# Patient Record
Sex: Male | Born: 1940 | Race: White | Hispanic: No | Marital: Single | Smoking: Never smoker
Health system: Southern US, Community
[De-identification: ages and names within clinical notes are randomized; demographics above are authoritative.]

---

## 2007-07-14 ENCOUNTER — Encounter: Admission: RE | Admit: 2007-07-14 | Discharge: 2007-07-14 | Payer: Self-pay | Admitting: Orthopedic Surgery

## 2009-01-10 ENCOUNTER — Encounter: Admission: RE | Admit: 2009-01-10 | Discharge: 2009-01-10 | Payer: Self-pay | Admitting: Sports Medicine

## 2009-11-28 ENCOUNTER — Encounter: Admission: RE | Admit: 2009-11-28 | Discharge: 2009-11-28 | Payer: Self-pay | Admitting: Sports Medicine

## 2009-12-19 ENCOUNTER — Encounter: Admission: RE | Admit: 2009-12-19 | Discharge: 2009-12-19 | Payer: Self-pay | Admitting: Sports Medicine

## 2011-04-02 ENCOUNTER — Ambulatory Visit
Admission: RE | Admit: 2011-04-02 | Discharge: 2011-04-02 | Disposition: A | Discharge status: Home / Self Care | Payer: Self-pay | Source: Ambulatory Visit | Attending: Sports Medicine | Admitting: Sports Medicine

## 2011-04-02 ENCOUNTER — Other Ambulatory Visit: Payer: Self-pay | Admitting: Sports Medicine

## 2011-04-02 DIAGNOSIS — M25561 Pain in right knee: Secondary | ICD-10-CM

## 2011-08-04 ENCOUNTER — Other Ambulatory Visit: Payer: Self-pay | Admitting: Orthopaedic Surgery

## 2011-08-04 DIAGNOSIS — R609 Edema, unspecified: Secondary | ICD-10-CM

## 2011-08-04 DIAGNOSIS — M79604 Pain in right leg: Secondary | ICD-10-CM

## 2011-08-05 ENCOUNTER — Ambulatory Visit
Admission: RE | Admit: 2011-08-05 | Discharge: 2011-08-05 | Disposition: A | Discharge status: Home / Self Care | Payer: 59 | Source: Ambulatory Visit | Attending: Orthopaedic Surgery | Admitting: Orthopaedic Surgery

## 2011-08-05 ENCOUNTER — Other Ambulatory Visit: Payer: Self-pay | Admitting: Orthopaedic Surgery

## 2011-08-05 DIAGNOSIS — R609 Edema, unspecified: Secondary | ICD-10-CM

## 2011-08-05 DIAGNOSIS — M79604 Pain in right leg: Secondary | ICD-10-CM

## 2012-02-03 IMAGING — CR DG FOOT COMPLETE 3+V*R*
3 series · 3 of 3 positions shown · non-contrast
Comparison: Right foot films of 07/14/2007

CLINICAL DATA: Foot pain, no injury

RIGHT FOOT COMPLETE - 3+ VIEW

[view not recorded (1 of 3)]
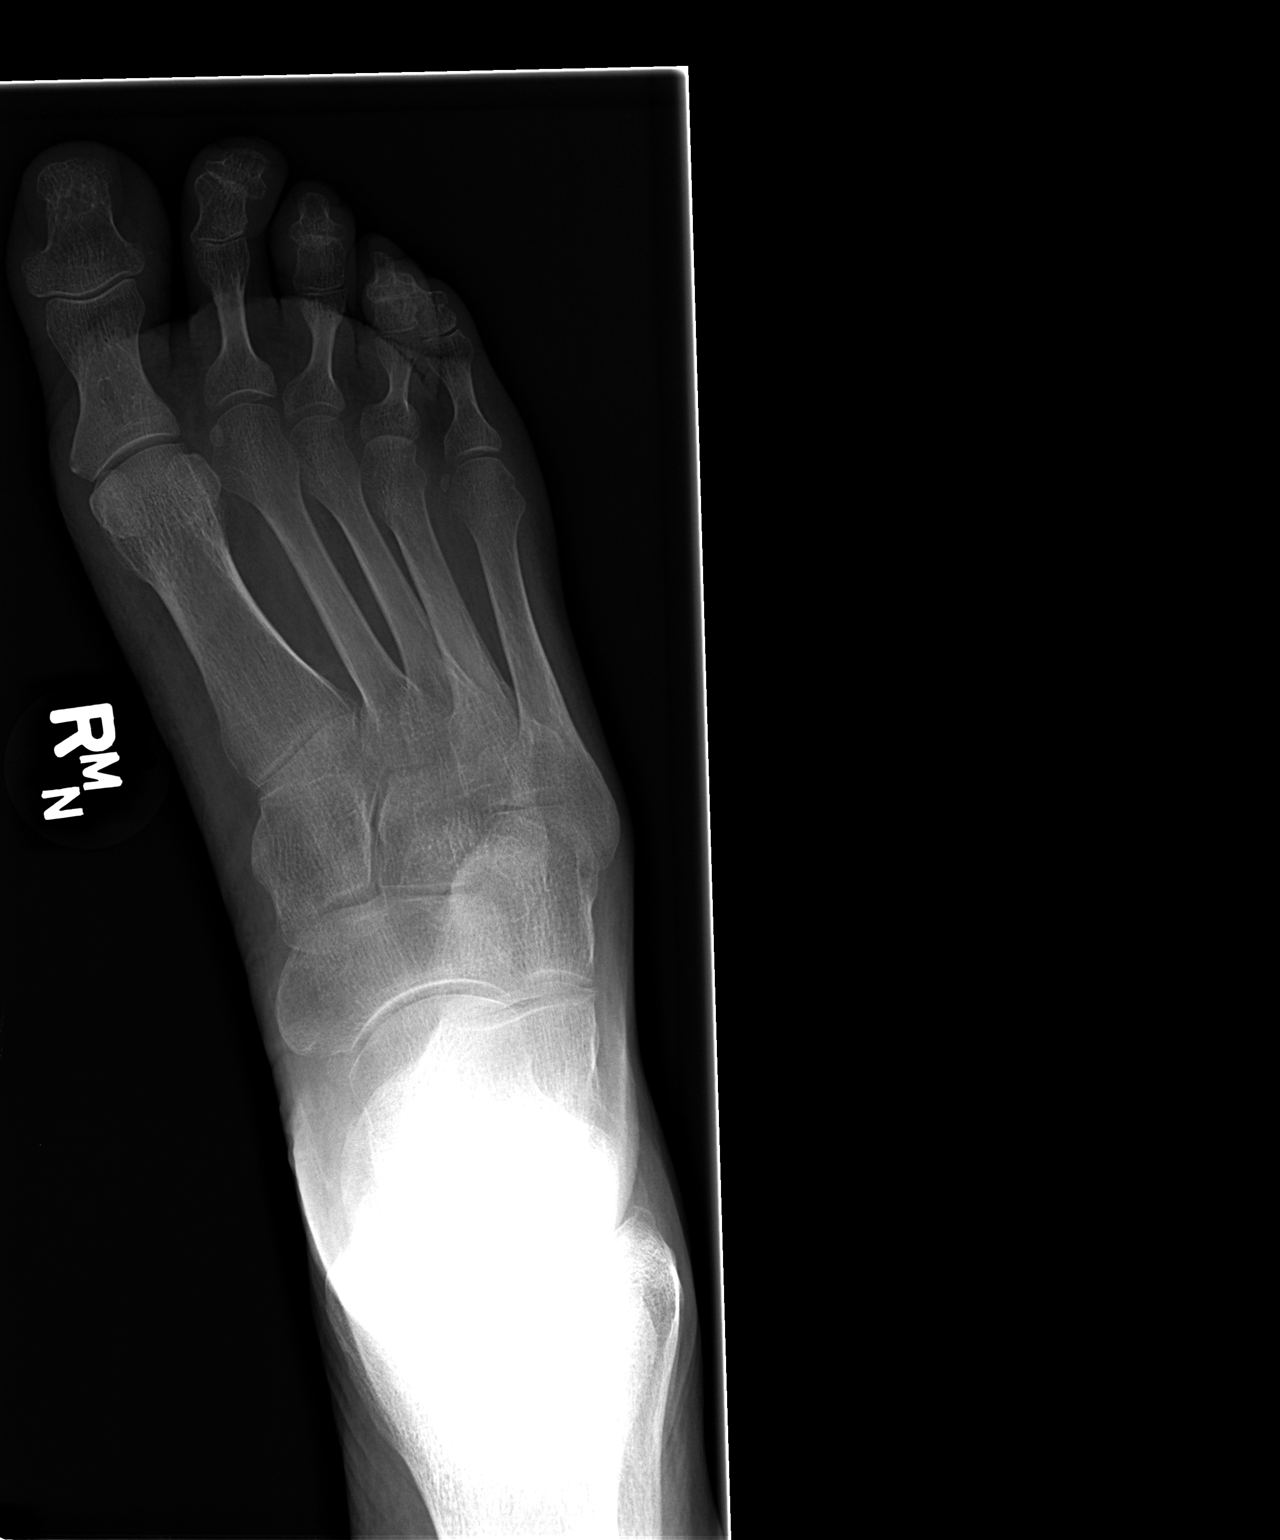

[view not recorded (2 of 3)]
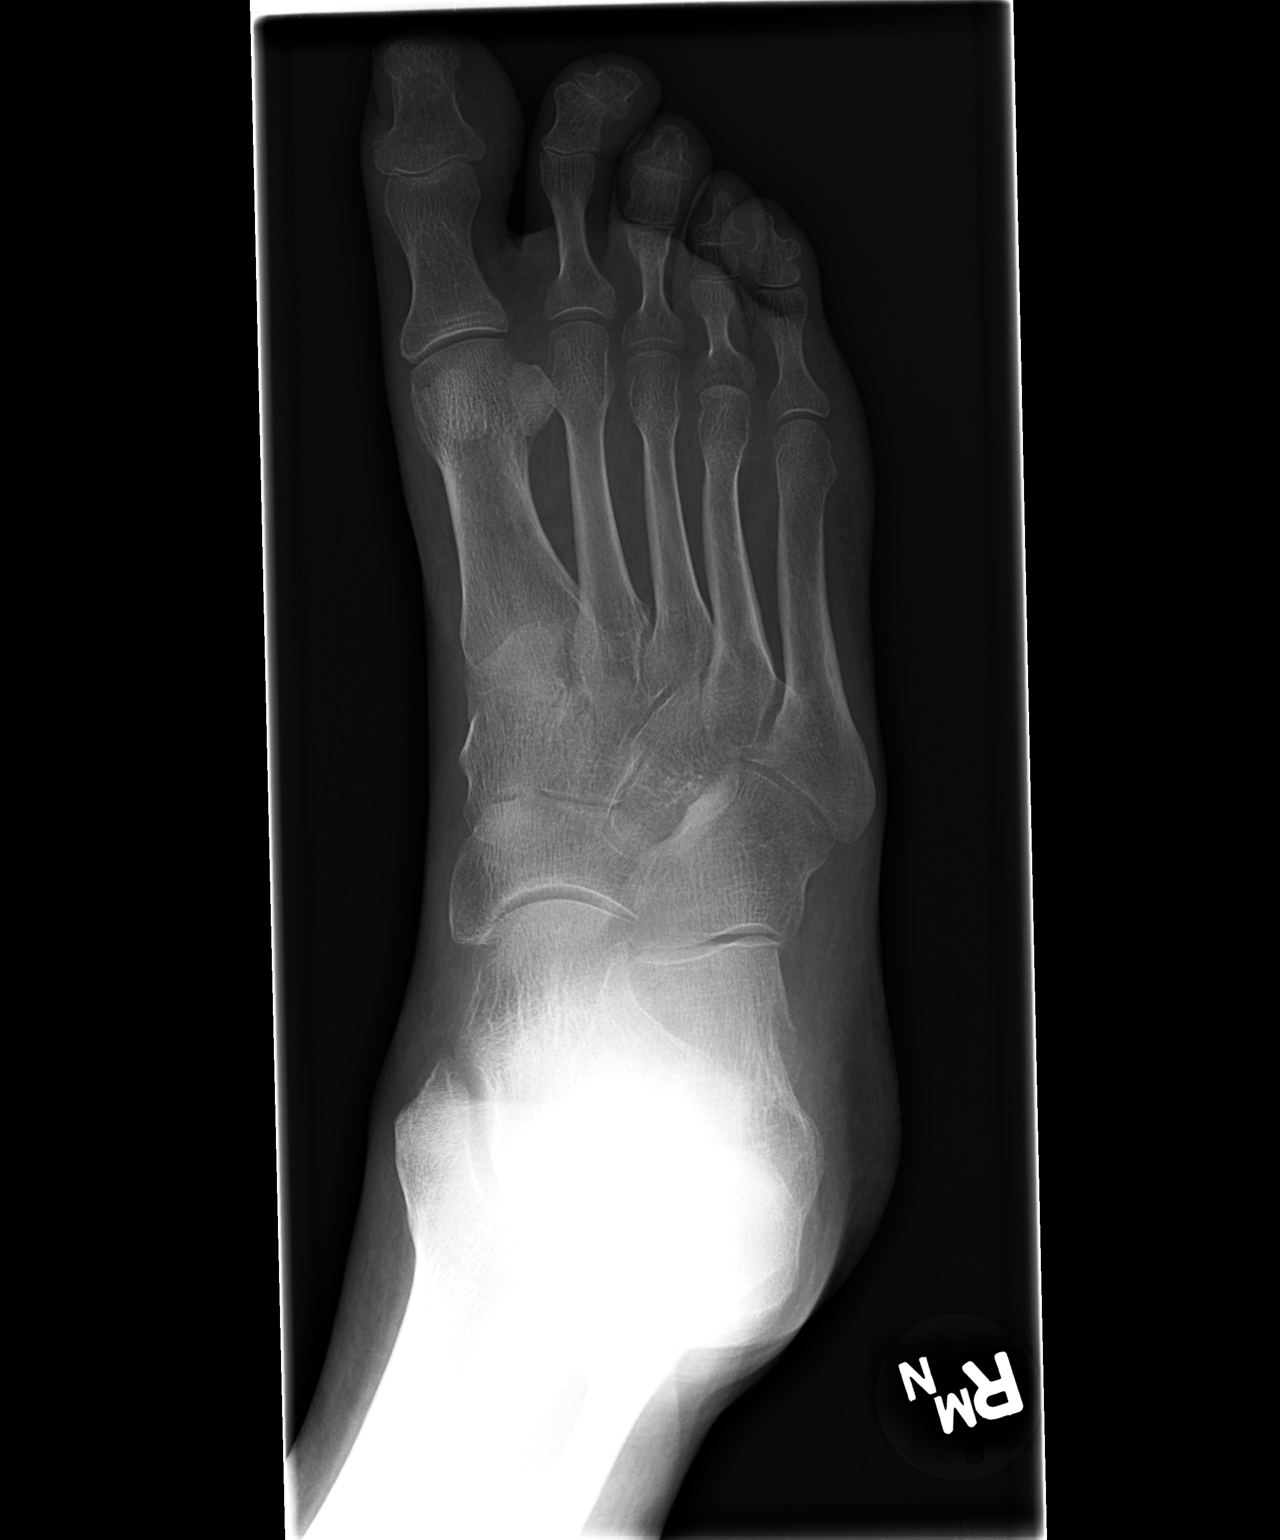

[view not recorded (3 of 3)]
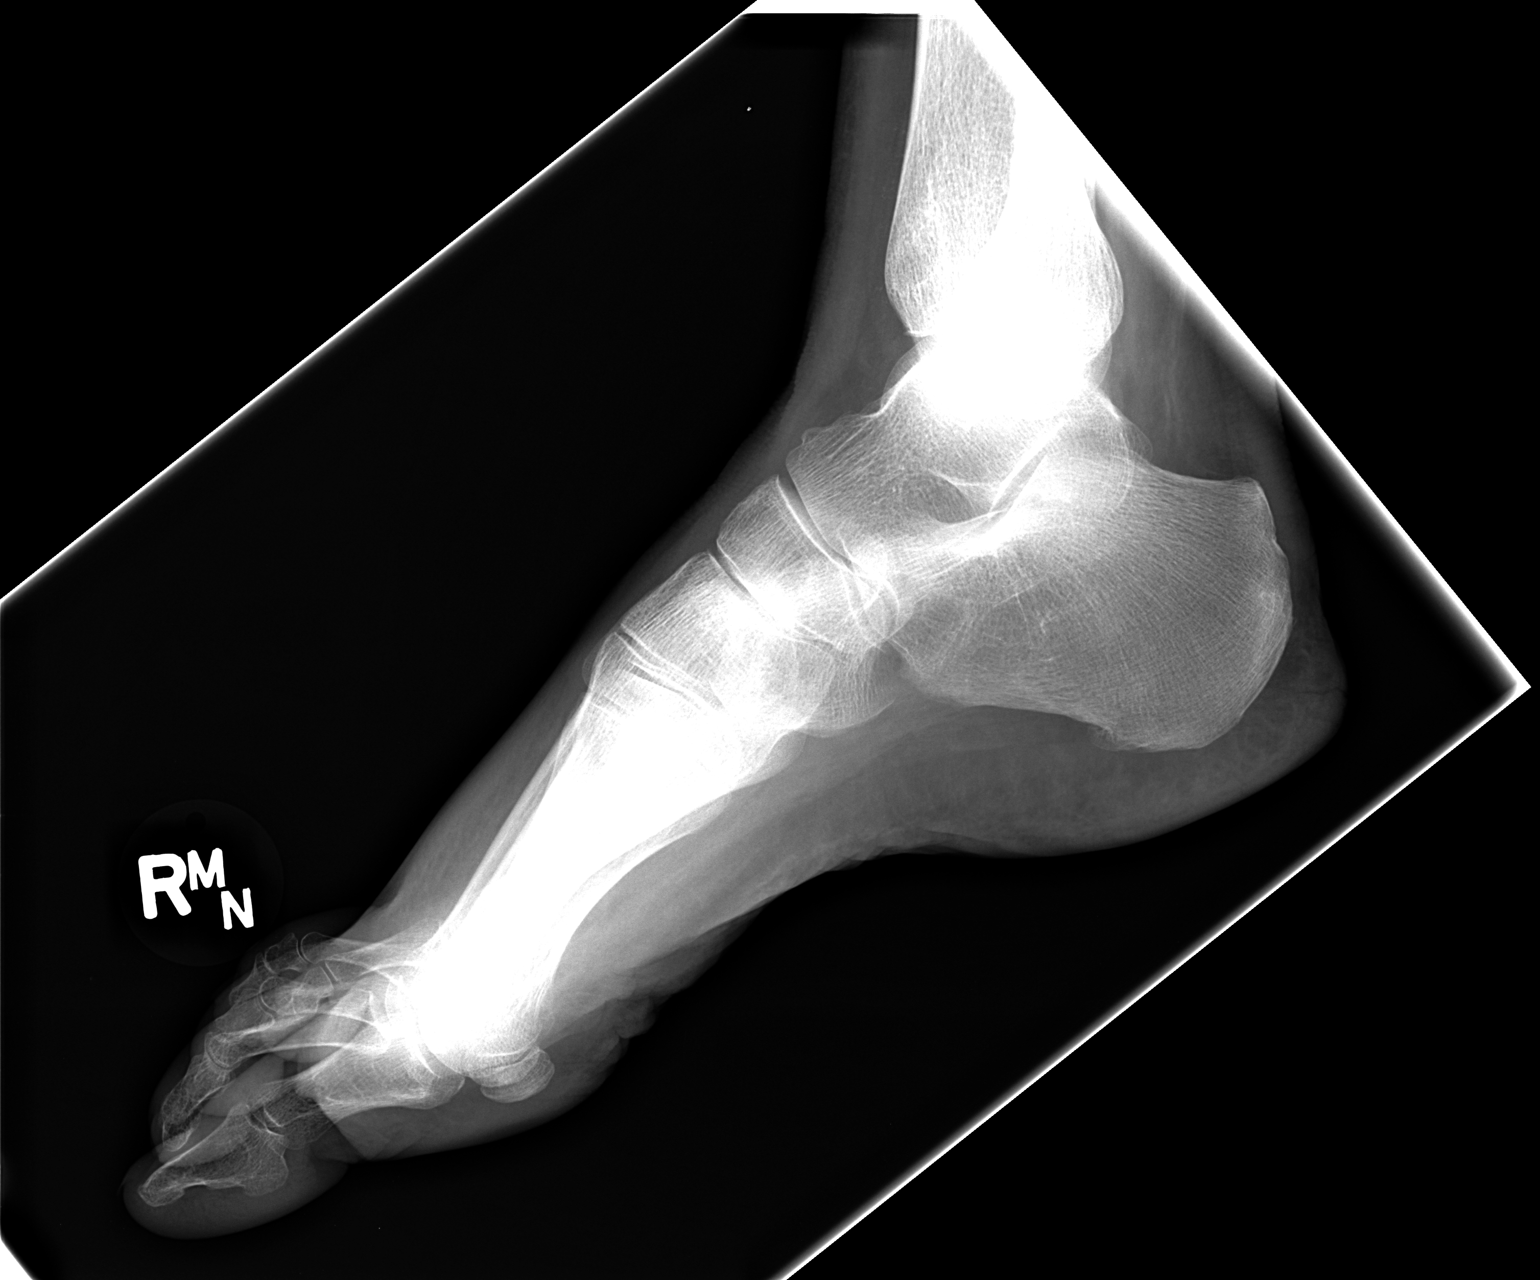

[3 of 3 positions shown; findings below may reference images not displayed]

FINDINGS: Tarsal - metatarsal alignment is normal.  No acute bony
abnormality is seen.  Joint spaces appear normal.  No erosion is
seen.
IMPRESSION: Negative right foot.

## 2013-08-02 ENCOUNTER — Encounter: Payer: Self-pay | Admitting: Sports Medicine

## 2013-08-02 ENCOUNTER — Ambulatory Visit (INDEPENDENT_AMBULATORY_CARE_PROVIDER_SITE_OTHER): Payer: 59 | Admitting: Sports Medicine

## 2013-08-02 ENCOUNTER — Ambulatory Visit (INDEPENDENT_AMBULATORY_CARE_PROVIDER_SITE_OTHER): Payer: 59

## 2013-08-02 VITALS — BP 164/79 | Ht 76.0 in | Wt 260.0 lb

## 2013-08-02 DIAGNOSIS — S82899A Other fracture of unspecified lower leg, initial encounter for closed fracture: Secondary | ICD-10-CM

## 2013-08-02 DIAGNOSIS — M549 Dorsalgia, unspecified: Secondary | ICD-10-CM

## 2013-08-02 DIAGNOSIS — M25579 Pain in unspecified ankle and joints of unspecified foot: Secondary | ICD-10-CM

## 2013-08-02 DIAGNOSIS — X58XXXA Exposure to other specified factors, initial encounter: Secondary | ICD-10-CM

## 2013-08-02 NOTE — Progress Notes (Addendum)
   Subjective:    Patient ID: Christian Gonzales, male    DOB: 1940/06/27, 73 y.o.   MRN: [id number]  HPI  Mr. Christian Gonzales has a history of chronic bilateral foot pain treated previously with insoles and presents today complaining of left foot and lower leg pain after falling 3 weeks ago. He fell in the middle of the night 3 weeks ago while getting out of bed he felt lightheaded, fell and hit his head on the dresser and his foot on the nightstand. He was unconscious for 10-15 minutes. When he woke up he could not move his left foot and could not walk on it. He crawled back into bed and went back to sleep. [facility] he said they did X-rays of his foot and found no broken bones only bruising. Pain is worse with walking and improved with icing his foot 2x/day, using his oxycodone 5 mg Q4H, and elevating his foot. Pain is sharp and shooting in nature and occurs frequently. It is similar to pain he has experienced in the past although worse. He says that the pain involves the entire foot but the dorsum is the worst location.   He has also noticed some increasing lower extremity swelling and pain. It is sharp and shooting in nature and located on the lower portion of the posterior calf. This pain is constant and worse with walking. He does not think there is anything that helps it. Denies history of DVT or PE. He does have a history of lower extremity edema. In fact, he has had Doppler studies in the past to rule out DVT which have been negative. He is taking Plavix and amlodipine.  Complete list of medications are reviewed Allergies are reviewed  He has not been wearing his insoles.  Review of Systems Negative apart from HPI     Objective:   Physical Exam Well-developed, well-nourished. No acute distress. Sitting comfortable in exam room  Examination of each foot shows some mild dorsal swelling bilaterally. The left foot is tender to palpation diffusely across the dorsum of  the foot. No ecchymosis. Skin is intact. Patient has bilateral lower extremity edema; 1+ pitting edema on the right, 2+ pitting edema on the left Diffuse tenderness to palpation along both lower extremities. Negative Homans bilaterally. Patient walks with an antalgic gait and the assistance of a cane.      Assessment & Plan:  1. Chronic bilateral foot pain, left greater than right 2. bilateral lower extremity edema, left greater than right  I will order repeat x-rays of his left foot and ankle to rule out an occult fracture which may not have been seen on previous films. I've given him green sports insoles for his shoes and I will call him with results of the x-ray once available. If unremarkable, patient will need to return to his PCP to further workup and treat his bilateral lower extremity edema.   Addendum: X-rays reviewed. No acute bony findings in the foot. Questionable tiny medial malleolar avulsion fracture. I spoke with the patient on the phone and reassured him of these findings. The questionable avulsion fracture off the medial malleolus does not need to be immobilized. I've recommended that the patient followup with his primary care physician sometime in the next couple of days to address his lower extremity edema. Some of his discomfort may be coming from excess fluid in his legs and feet. He will followup with me when necessary.

## 2015-10-08 IMAGING — CR DG FOOT 2V*L*
2 series · 2 of 2 positions shown · non-contrast
Comparison: Left foot films of 12/19/2009

CLINICAL DATA: Fell 3 weeks ago with pain

EXAM:
LEFT FOOT - 2 VIEW

[view not recorded (1 of 2)]
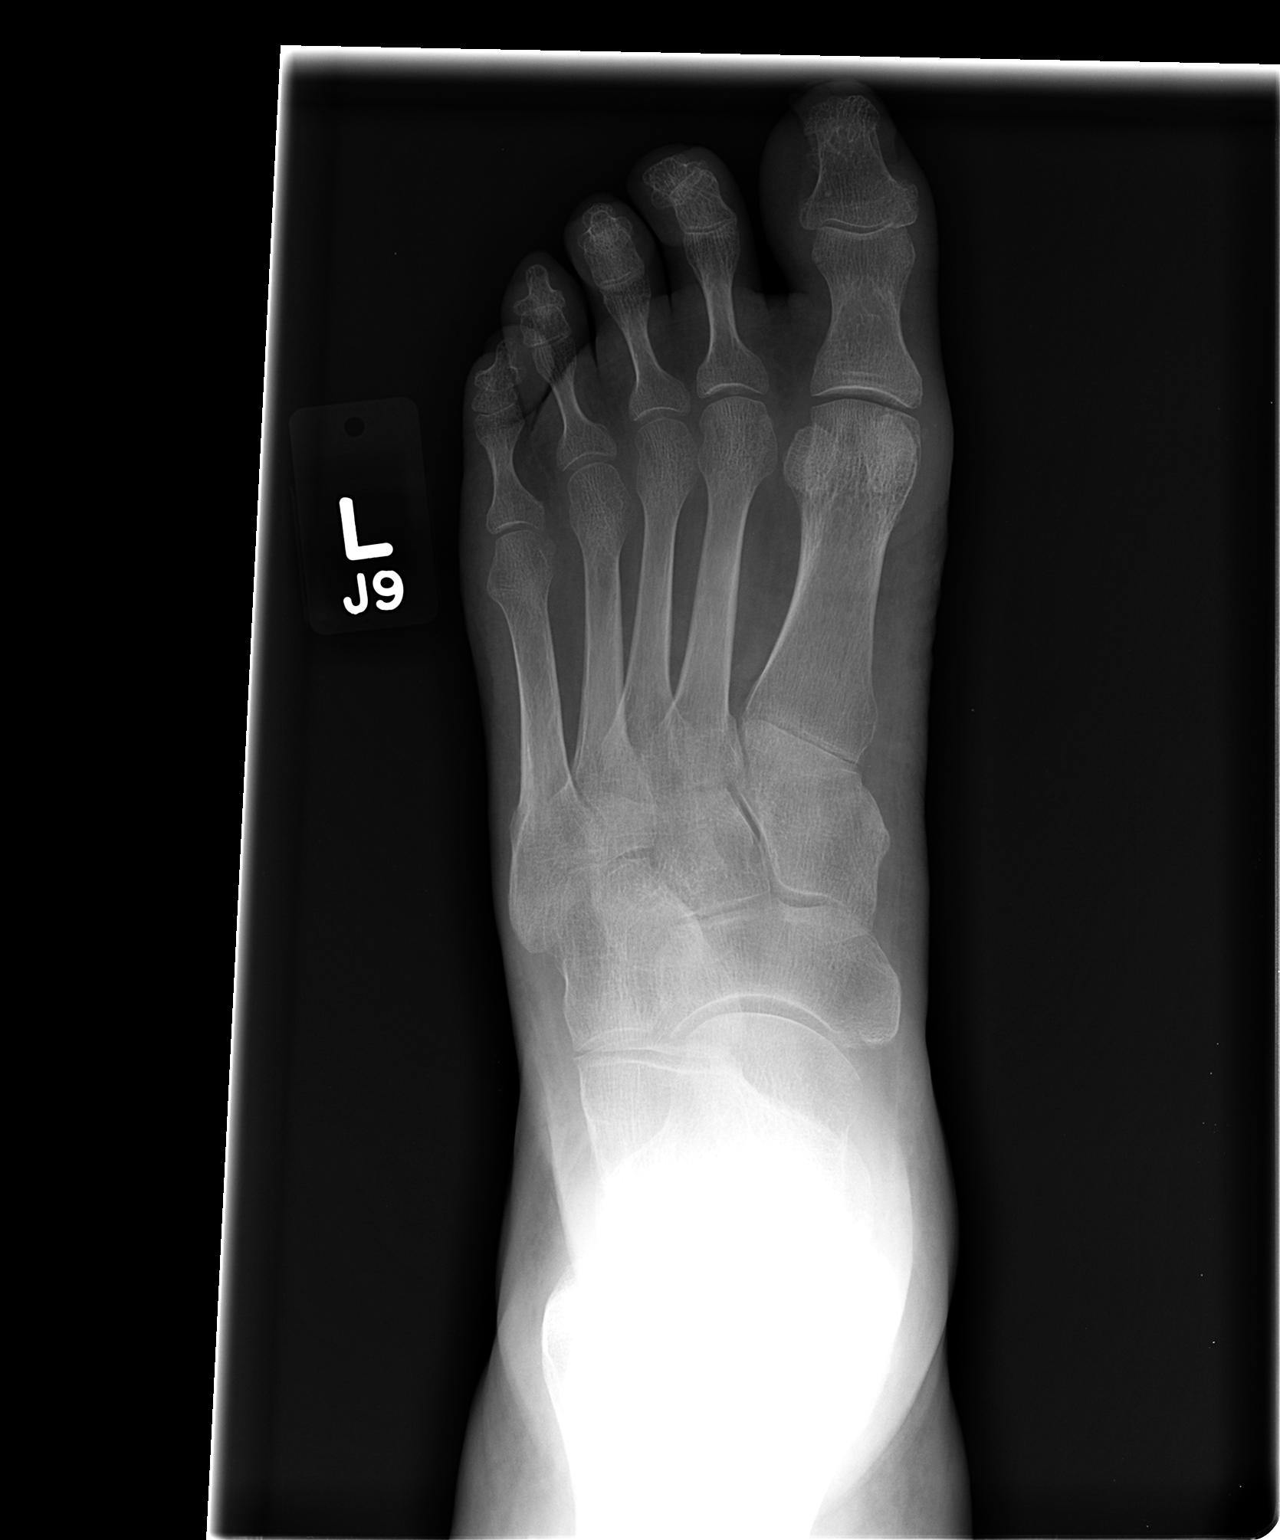

[view not recorded (2 of 2)]
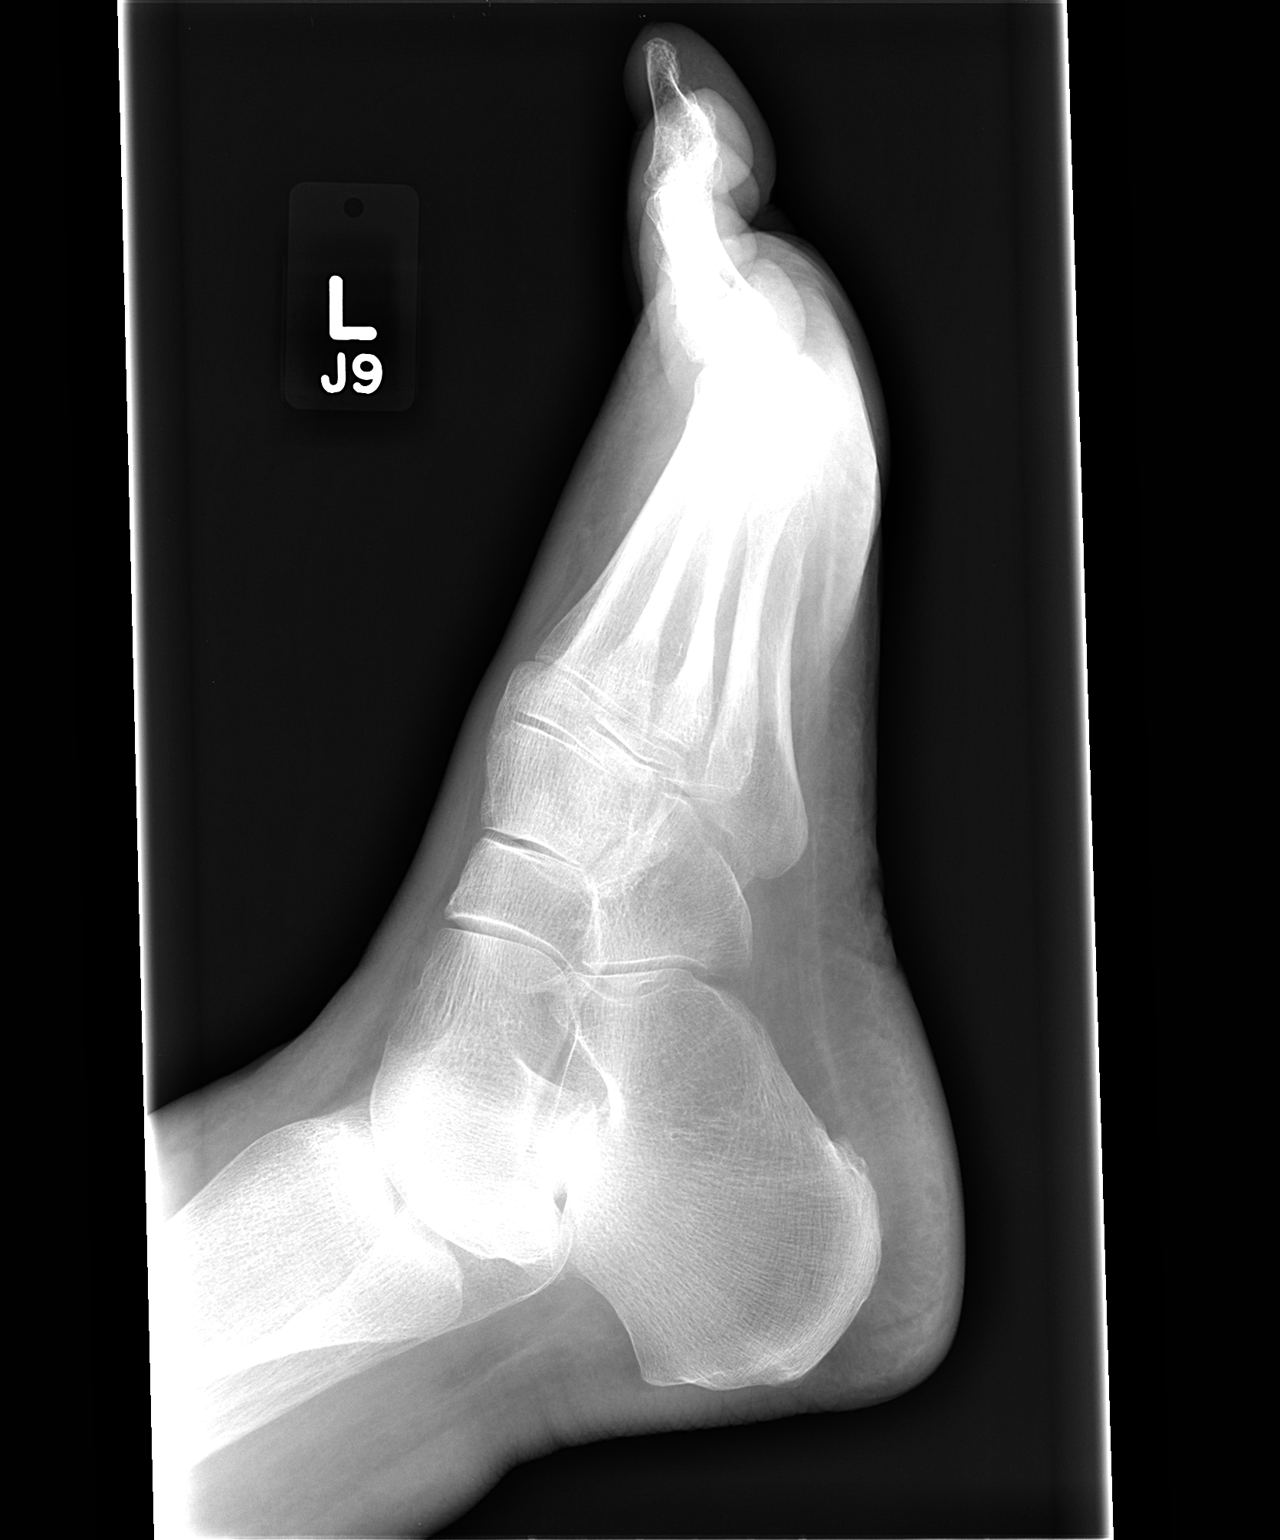

[2 of 2 positions shown; findings below may reference images not displayed]

FINDINGS: Tarsal-metatarsal alignment is normal. No acute fracture is seen.
Joint spaces appear normal.
IMPRESSION: Negative.
